# Patient Record
Sex: Male | Born: 1955 | Race: White | Marital: Married | Smoking: Former smoker
Health system: Southern US, Community
[De-identification: ages and names within clinical notes are randomized; demographics above are authoritative.]

## PROBLEM LIST (undated history)

## (undated) DIAGNOSIS — B20 Human immunodeficiency virus [HIV] disease: Secondary | ICD-10-CM

## (undated) DIAGNOSIS — Z21 Asymptomatic human immunodeficiency virus [HIV] infection status: Secondary | ICD-10-CM

## (undated) HISTORY — DX: Human immunodeficiency virus (HIV) disease: B20

## (undated) HISTORY — DX: Asymptomatic human immunodeficiency virus (hiv) infection status: Z21

---

## 2021-10-07 ENCOUNTER — Other Ambulatory Visit: Payer: Self-pay | Admitting: Family Medicine

## 2021-10-07 DIAGNOSIS — R1012 Left upper quadrant pain: Secondary | ICD-10-CM

## 2021-10-14 ENCOUNTER — Ambulatory Visit
Admission: RE | Admit: 2021-10-14 | Discharge: 2021-10-14 | Disposition: A | Discharge status: Home / Self Care | Source: Ambulatory Visit | Attending: Family Medicine | Admitting: Family Medicine

## 2021-10-14 ENCOUNTER — Other Ambulatory Visit: Payer: Self-pay | Admitting: Family Medicine

## 2021-10-14 DIAGNOSIS — R1012 Left upper quadrant pain: Secondary | ICD-10-CM

## 2021-10-14 DIAGNOSIS — Z21 Asymptomatic human immunodeficiency virus [HIV] infection status: Secondary | ICD-10-CM

## 2021-10-14 DIAGNOSIS — R1902 Left upper quadrant abdominal swelling, mass and lump: Secondary | ICD-10-CM

## 2021-10-14 MED ORDER — IOPAMIDOL (ISOVUE-300) INJECTION 61%
100.0000 mL | Freq: Once | INTRAVENOUS | Status: AC | PRN
  Administered 2021-10-14: 100 mL via INTRAVENOUS

## 2021-10-23 NOTE — Progress Notes (Unsigned)
Cardiology Office Note:    Date:  10/25/2021   ID:  Mitchell Huff, DOB 01-15-1956, MRN [id number]  PCP:  Pcp, No  Cardiologist:  None  Electrophysiologist:  None   Referring MD: Mitchell Caraway, MD   Chief Complaint  Patient presents with   Coronary Artery Disease    History of Present Illness:    Mitchell Huff is a 66 y.o. male with a hx of CAD, HIV, hypertension, hyperlipidemia, OSA who is referred by Dr. Addison Huff for evaluation of CAD.  Recently moved from [place].  Had calcium score in 2011, which was 142 (greater than 75th percentile).  Stress echocardiogram at that time showed no ischemia.  He was started on statins, but was intolerant and switched to [place].  Currently on Praluent.  He reports had chest pain in March 2023 and underwent nuclear stress test, which showed excellent exercise capacity (achieved 13.4 METS of activity) and normal perfusion.  He denies any recent dyspnea, lightheadedness, syncope, lower extremity edema, or palpitations.  He does 30 minutes of cardio every other day but has not been exercising recently due to back pain.  He smoked for few years, quit in early 8s.  No known family history of heart disease.   No past medical history on file.   Current Medications: Current Meds  Medication Sig   Alirocumab (PRALUENT) 150 MG/ML SOAJ See admin instructions.   aspirin EC 81 MG tablet 1 tablet   BIKTARVY 50-200-25 MG TABS tablet Take 1 tablet by mouth daily.   buPROPion (WELLBUTRIN XL) 300 MG 24 hr tablet 1 tablet in the morning   celecoxib (CELEBREX) 100 MG capsule Take 100 mg by mouth 2 (two) times daily.   losartan (COZAAR) 25 MG tablet 1 tablet   Multiple Vitamins-Minerals (MULTI FOR HIM 50+) TABS See admin instructions.   pregabalin (LYRICA) 75 MG capsule Take 75 mg by mouth 2 (two) times daily.   Testosterone 30 MG/ACT SOLN 1 pump every other day, 1/2 pump on remaining days   TRINTELLIX 20 MG TABS tablet Take 20 mg by mouth daily.      Allergies:   Doxycycline and Statins   Social History   Socioeconomic History   Marital status: Married    Spouse name: Not on file   Number of children: Not on file   Years of education: Not on file   Highest education level: Not on file  Occupational History   Not on file  Tobacco Use   Smoking status: Unknown   Smokeless tobacco: Not on file  Substance and Sexual Activity   Alcohol use: Not on file   Drug use: Not Currently   Sexual activity: Not on file  Other Topics Concern   Not on file  Social History Narrative   Not on file   Social Determinants of Health   Financial Resource Strain: Not on file  Food Insecurity: Not on file  Transportation Needs: Not on file  Physical Activity: Not on file  Stress: Not on file  Social Connections: Not on file     Family History: No known family history of heart disease.  ROS:   Please see the history of present illness.     All other systems reviewed and are negative.  EKGs/Labs/Other Studies Reviewed:    The following studies were reviewed today:   EKG:   10/24/2021 normal sinus rhythm, rate 64, no ST abnormalities  Recent Labs: No results found for requested labs within last 365 days.  Recent Lipid Panel  No results found for: "CHOL", "TRIG", "HDL", "CHOLHDL", "VLDL", "LDLCALC", "LDLDIRECT"  Physical Exam:    VS:  BP 119/70   Pulse 76   Ht 5\' 7"  (1.702 m)   Wt 167 lb 12.8 oz (76.1 kg)   SpO2 97%   BMI 26.28 kg/m     Wt Readings from Last 3 Encounters:  10/24/21 167 lb 12.8 oz (76.1 kg)     GEN:  Well nourished, well developed in no acute distress HEENT: Normal NECK: No JVD; No carotid bruits LYMPHATICS: No lymphadenopathy CARDIAC: RRR, no murmurs, rubs, gallops RESPIRATORY:  Clear to auscultation without rales, wheezing or rhonchi  ABDOMEN: Soft, non-tender, non-distended MUSCULOSKELETAL:  No edema; No deformity  SKIN: Warm and dry NEUROLOGIC:  Alert and oriented x 3 PSYCHIATRIC:  Normal  affect   ASSESSMENT:    1. Chest pain of uncertain etiology   2. Coronary artery disease involving native coronary artery of native heart without angina pectoris   3. Tobacco use   4. Hyperlipidemia, unspecified hyperlipidemia type    PLAN:     CAD:  Had calcium score in 2011, which was 142 (greater than 75th percentile).  Stress echocardiogram at that time showed no ischemia.  He was started on statins, but was intolerant and switched to [place].  Currently on Praluent.  He reports had chest pain in March 2023 and underwent nuclear stress test in [place], which showed excellent exercise capacity (achieved 13.4 METS of activity) and normal perfusion. -Check echocardiogram -Continue Praluent  Hypertension: On losartan 25 mg daily.  Appears controlled  Hyperlipidemia: On Praluent.  LDL 42 on 07/11/21  OSA: on CPAP, has upcoming appointment with sleep medicine.  HIV: On Biktarvy, follows with ID  RTC in 1 year   Medication Adjustments/Labs and Tests Ordered: Current medicines are reviewed at length with the patient today.  Concerns regarding medicines are outlined above.  Orders Placed This Encounter  Procedures   EKG 12-Lead   ECHOCARDIOGRAM COMPLETE   No orders of the defined types were placed in this encounter.   Patient Instructions  Medication Instructions:  Your physician recommends that you continue on your current medications as directed. Please refer to the Current Medication list given to you today.  *If you need a refill on your cardiac medications before your next appointment, please call your pharmacy*   Testing/Procedures: Your physician has requested that you have an echocardiogram. Echocardiography is a painless test that uses sound waves to create images of your heart. It provides your doctor with information about the size and shape of your heart and how well your heart's chambers and valves are working. This procedure takes approximately one hour. There are  no restrictions for this procedure.  This will be done at our [facility] location:  [place]: At [facility], you and your health needs are our priority.  As part of our continuing mission to provide you with exceptional heart care, we have created designated Provider Care Teams.  These Care Teams include your primary Cardiologist (physician) and Advanced Practice Providers (APPs -  Physician Assistants and Nurse Practitioners) who all work together to provide you with the care you need, when you need it.  We recommend signing up for the patient portal called "MyChart".  Sign up information is provided on this After Visit Summary.  MyChart is used to connect with patients for Virtual Visits (Telemedicine).  Patients are able to view lab/test results, encounter notes, upcoming appointments, etc.  Non-urgent messages can be sent to your provider as well.   To learn more about what you can do with MyChart, go to [email or web address].    Your next appointment:   12 month(s)  The format for your next appointment:   In Person  Provider:   Dr. Gardiner Rhyme  Important Information About Sugar         Signed, Donato Heinz, MD  10/25/2021 7:05 AM    [place]

## 2021-10-24 ENCOUNTER — Ambulatory Visit (INDEPENDENT_AMBULATORY_CARE_PROVIDER_SITE_OTHER): Admitting: Cardiology

## 2021-10-24 ENCOUNTER — Encounter: Payer: Self-pay | Admitting: Cardiology

## 2021-10-24 VITALS — BP 119/70 | HR 76 | Ht 67.0 in | Wt 167.8 lb

## 2021-10-24 DIAGNOSIS — Z720 Tobacco use: Secondary | ICD-10-CM

## 2021-10-24 DIAGNOSIS — E785 Hyperlipidemia, unspecified: Secondary | ICD-10-CM | POA: Diagnosis not present

## 2021-10-24 DIAGNOSIS — R079 Chest pain, unspecified: Secondary | ICD-10-CM

## 2021-10-24 DIAGNOSIS — I2510 Atherosclerotic heart disease of native coronary artery without angina pectoris: Secondary | ICD-10-CM

## 2021-10-24 NOTE — Patient Instructions (Signed)
Medication Instructions:  Your physician recommends that you continue on your current medications as directed. Please refer to the Current Medication list given to you today.  *If you need a refill on your cardiac medications before your next appointment, please call your pharmacy*   Testing/Procedures: Your physician has requested that you have an echocardiogram. Echocardiography is a painless test that uses sound waves to create images of your heart. It provides your doctor with information about the size and shape of your heart and how well your heart's chambers and valves are working. This procedure takes approximately one hour. There are no restrictions for this procedure.  This will be done at our [facility] location:  [address]  Follow-Up: At [facility], you and your health needs are our priority.  As part of our continuing mission to provide you with exceptional heart care, we have created designated Provider Care Teams.  These Care Teams include your primary Cardiologist (physician) and Advanced Practice Providers (APPs -  Physician Assistants and Nurse Practitioners) who all work together to provide you with the care you need, when you need it.  We recommend signing up for the patient portal called "MyChart".  Sign up information is provided on this After Visit Summary.  MyChart is used to connect with patients for Virtual Visits (Telemedicine).  Patients are able to view lab/test results, encounter notes, upcoming appointments, etc.  Non-urgent messages can be sent to your provider as well.   To learn more about what you can do with MyChart, go to [email or web address].    Your next appointment:   12 month(s)  The format for your next appointment:   In Person  Provider:   Dr. Bjorn Pippin  Important Information About Sugar

## 2021-10-27 ENCOUNTER — Ambulatory Visit
Admission: RE | Admit: 2021-10-27 | Discharge: 2021-10-27 | Disposition: A | Discharge status: Home / Self Care | Source: Ambulatory Visit | Attending: Sports Medicine | Admitting: Sports Medicine

## 2021-10-27 ENCOUNTER — Other Ambulatory Visit: Payer: Self-pay | Admitting: Sports Medicine

## 2021-10-27 DIAGNOSIS — M545 Low back pain, unspecified: Secondary | ICD-10-CM

## 2021-10-27 DIAGNOSIS — M546 Pain in thoracic spine: Secondary | ICD-10-CM

## 2021-11-01 ENCOUNTER — Institutional Professional Consult (permissible substitution): Admitting: Pulmonary Disease

## 2021-11-10 ENCOUNTER — Telehealth (HOSPITAL_COMMUNITY): Payer: Self-pay | Admitting: Cardiology

## 2021-11-10 NOTE — Telephone Encounter (Signed)
Patient called and cancelled echocardiogram for 11/14/21 and will call at a later date to reschedule.  Order will be removed from the active echo WQ and if patient calls back we will reinstate the order. Thank you.

## 2021-11-14 ENCOUNTER — Other Ambulatory Visit (HOSPITAL_COMMUNITY)

## 2021-11-19 NOTE — Progress Notes (Deleted)
11/22/21- 65 yoM   for sleep evaluation with concern of OSA on CPAP, courtesy of Moshe Cipro, NP Medical problem list includes OSA, HIV, CAD, HTN, Hypercholesterolemia, Goiter, Depression, Epworth score- Body weight today- Covid vax-

## 2021-11-22 ENCOUNTER — Institutional Professional Consult (permissible substitution): Admitting: Internal Medicine

## 2022-01-01 NOTE — Progress Notes (Unsigned)
01/02/22- 66 yoM former smoker,  for sleep evaluation courtesy of Moshe Cipro, NP with concern of OSA on CPAP Medical problem list includes- CAD,  Hyperlipidemia, Depression, HIV, HTN,   Epworth score-6 Body weight today-170 lbs Covid vax-5 Moderna CPAP 7/ Download-brought SD card- compliance 23%, AHI 0.3/ hr -----Pt is here for sleep consult for sleep apnea. Pt states he is using cpap already. He states that he has not had his settings checked in a while. Pt states he does snore when not using cpap machine. Pt states that the settings are ok as of right now. Epworth is 6. Has had 5 covid vaccines all Moderna, and flu shot last year. Last sleep study 2016 HST in [place].

## 2022-01-02 ENCOUNTER — Encounter: Payer: Self-pay | Admitting: Internal Medicine

## 2022-01-02 ENCOUNTER — Ambulatory Visit (INDEPENDENT_AMBULATORY_CARE_PROVIDER_SITE_OTHER): Admitting: Internal Medicine

## 2022-01-02 DIAGNOSIS — G4733 Obstructive sleep apnea (adult) (pediatric): Secondary | ICD-10-CM | POA: Diagnosis not present

## 2022-01-02 DIAGNOSIS — I2510 Atherosclerotic heart disease of native coronary artery without angina pectoris: Secondary | ICD-10-CM | POA: Diagnosis not present

## 2022-01-02 NOTE — Patient Instructions (Signed)
Order- schedule home sleep test   dx OSA  Please call us about 2 weeks after your sleep test for results and recommendations.  We expect to be able to get you a replacement for your old CPAP machine with a local DME company.

## 2022-01-04 ENCOUNTER — Encounter: Payer: Self-pay | Admitting: Internal Medicine

## 2022-01-04 DIAGNOSIS — I2510 Atherosclerotic heart disease of native coronary artery without angina pectoris: Secondary | ICD-10-CM | POA: Insufficient documentation

## 2022-01-04 DIAGNOSIS — G4733 Obstructive sleep apnea (adult) (pediatric): Secondary | ICD-10-CM | POA: Insufficient documentation

## 2022-01-04 NOTE — Assessment & Plan Note (Signed)
He will try to obtain original diagnostic sleep study from [place] but we will go ahead updated sleep study for documentation.  With that information we can establish him with a DME company and replace old machine.  Current settings pressure is 7 CWP and we will probably change him to AutoPap 5-15.

## 2022-01-04 NOTE — Assessment & Plan Note (Addendum)
Reported by patient °

## 2022-02-09 ENCOUNTER — Telehealth: Payer: Self-pay | Admitting: Internal Medicine

## 2022-02-10 ENCOUNTER — Other Ambulatory Visit: Payer: Self-pay | Admitting: *Deleted

## 2022-02-10 DIAGNOSIS — G4733 Obstructive sleep apnea (adult) (pediatric): Secondary | ICD-10-CM

## 2022-02-10 NOTE — Telephone Encounter (Signed)
Called and spoke with patient, advised that one of our Pccs would call him to get him scheduled to come in the office to be shown how to use the equipment.  Order placed for the HST.  Nothing further needed.

## 2022-02-21 ENCOUNTER — Ambulatory Visit

## 2022-02-21 DIAGNOSIS — G4733 Obstructive sleep apnea (adult) (pediatric): Secondary | ICD-10-CM | POA: Diagnosis not present

## 2022-02-24 ENCOUNTER — Encounter

## 2022-02-24 DIAGNOSIS — G4733 Obstructive sleep apnea (adult) (pediatric): Secondary | ICD-10-CM | POA: Diagnosis not present

## 2022-03-28 ENCOUNTER — Telehealth: Payer: Self-pay | Admitting: Internal Medicine

## 2022-03-29 NOTE — Telephone Encounter (Signed)
His home sleep test showed mild obstructive sleep apnea, 7 apneas/ hour. For scores in this range, he could choose not to be treated, but we would encourage him to keep his weight down and sleep off flat of back.  If he feels better with CPAP and wants to continue, then we need to order new local DME, replace old CPAP machine, auto 5-15, mask of choice, humidifier, supplies, AirView.    And I would need to see him again in 1-90 days for follow-up.

## 2022-03-29 NOTE — Telephone Encounter (Signed)
Called and spoke with patient. Patient stated that he wanted to know if he needed to continue on his cpap machine and if he qualifies to get a new one since his results said he had moderate sleep apnea.   CY, please advise.

## 2022-03-29 NOTE — Telephone Encounter (Signed)
Called and spoke with patient. Patient verbalized understanding. Patient stated that he wanted to think about this and that he would give [place] a call back either tomorrow or Friday. Will leave encounter open until we hear back from patient.

## 2022-04-04 NOTE — Telephone Encounter (Signed)
Pt wants to discuss getting a CPAP machine with a nurse. Please call back.

## 2022-04-04 NOTE — Telephone Encounter (Signed)
Called patient and he states he wanted to move forward with the cpap. But as I was placing the order he states his insurance might be changing. So I did recommend that he call his insurance to determine what he needs to do is his insurance changes, in regards to cpap cost and compliance. He states he will call his insurance and give [place] a call back when he finds out his information. Nothing further needed

## 2023-11-06 IMAGING — CR DG LUMBAR SPINE 2-3V
3 series · 3 of 3 positions shown · non-contrast
Comparison: None Available.

CLINICAL DATA: Low back pain

EXAM:
LUMBAR SPINE - 2-3 VIEW

[w lumbar spine ap]
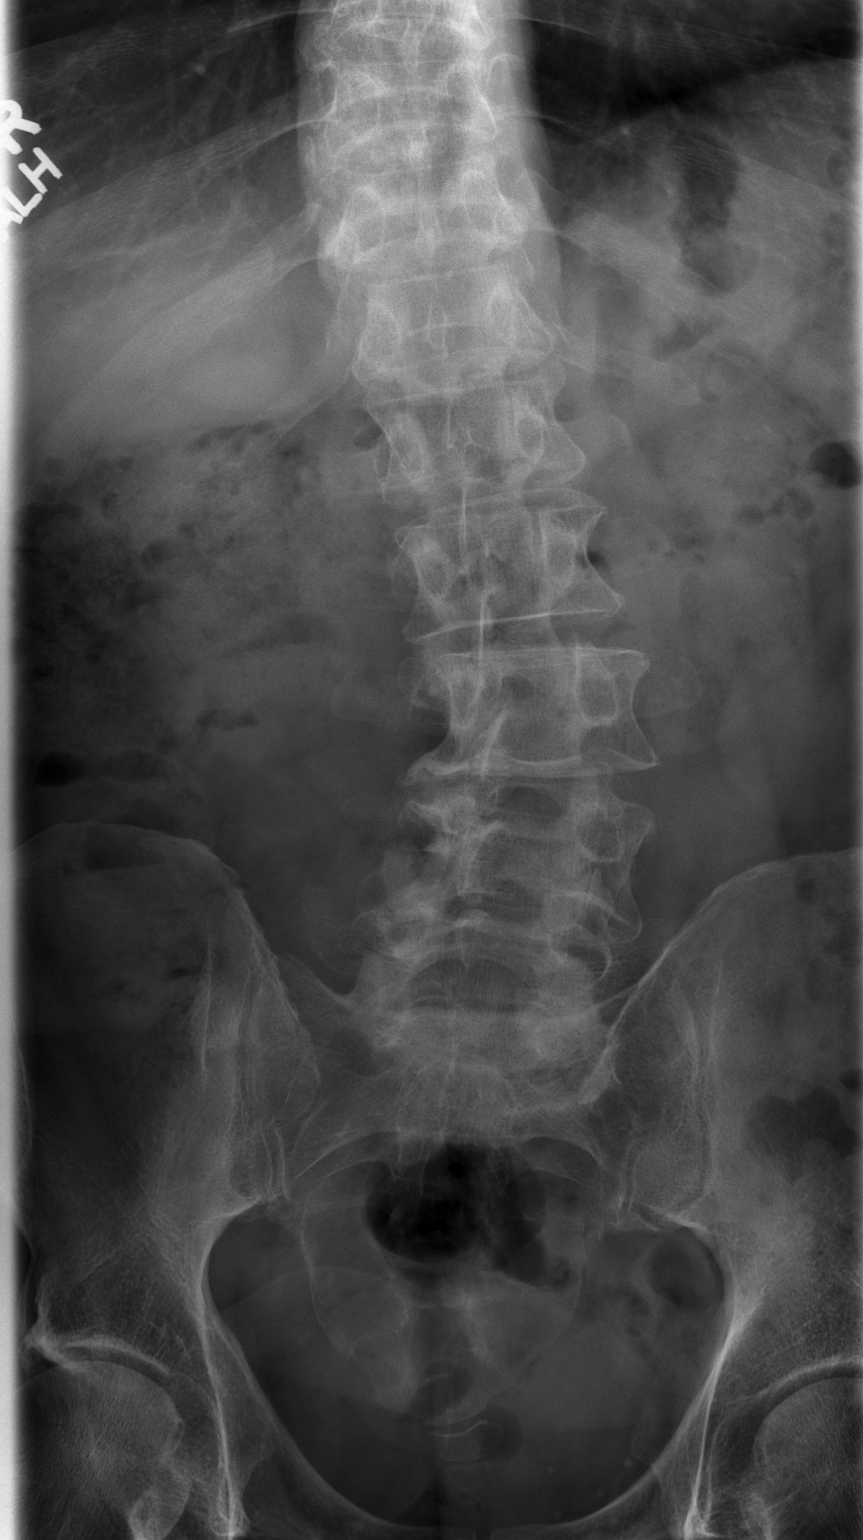

[w lumbar spine lat]
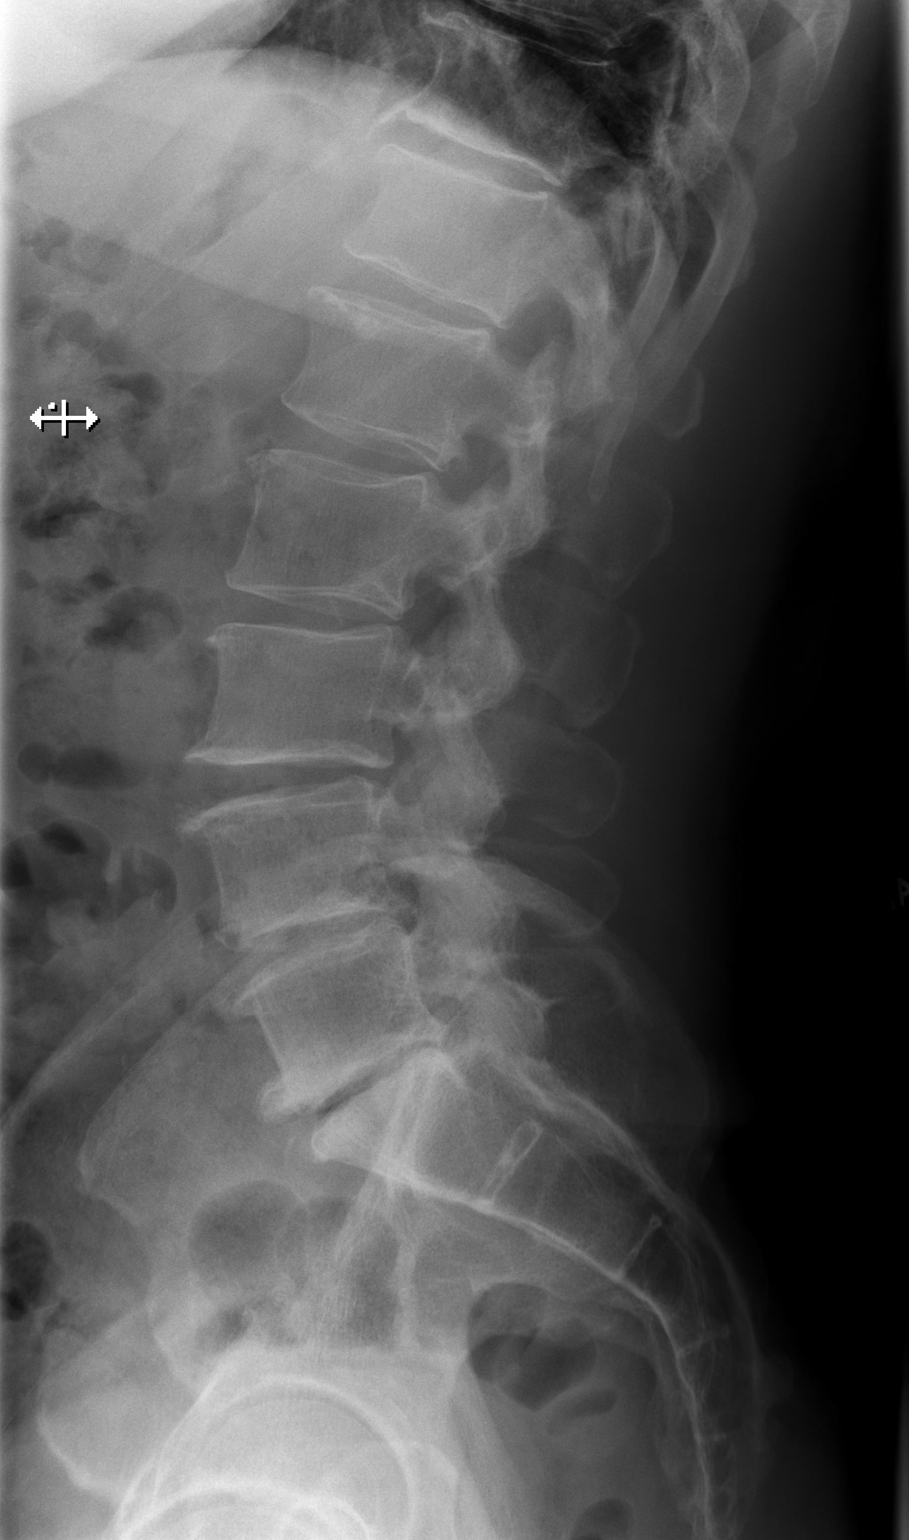

[w lumbar l-5 s-1 spot]
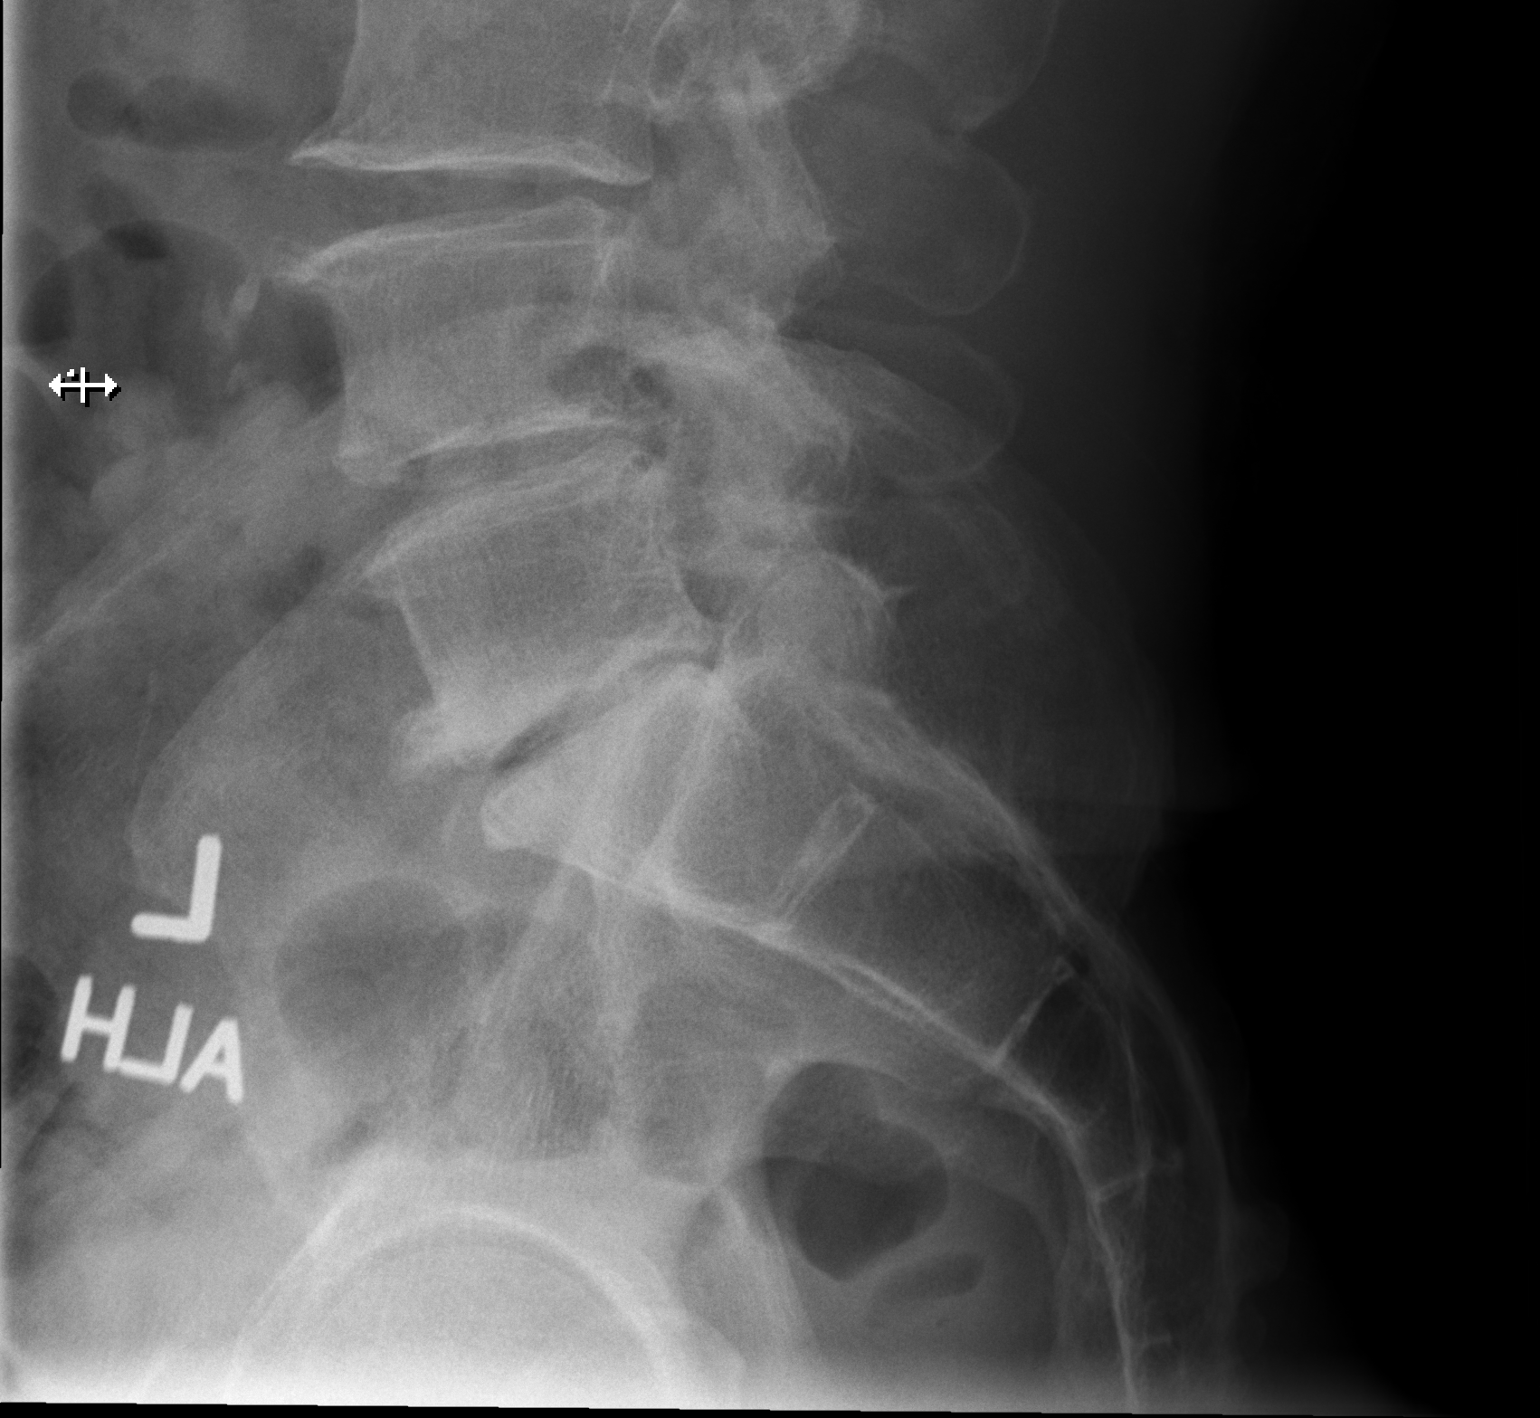

[3 of 3 positions shown; findings below may reference images not displayed]

FINDINGS: Levo rotoscoliosis of the lumbar spine. Vertebral body height are
preserved without evidence of fracture. Minimal grade 1
retrolisthesis of L3 on L4. Facet arthropathy most significant from
L4-S1. Moderate to severe intervertebral disc space narrowing at
L4-L5 and L5-S1 with associated endplate sclerosis and osteophytes.
Mild disc space narrowing at L3-L4.
IMPRESSION: Levoscoliosis and degenerative changes of the lumbar spine.
